# Patient Record
Sex: Male | Born: 1990 | Race: White | Hispanic: No | Marital: Single | Smoking: Never smoker
Health system: Southern US, Community
[De-identification: ages and names within clinical notes are randomized; demographics above are authoritative.]

## PROBLEM LIST (undated history)

## (undated) DIAGNOSIS — E7800 Pure hypercholesterolemia, unspecified: Secondary | ICD-10-CM

---
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
## 2010-07-22 ENCOUNTER — Emergency Department (HOSPITAL_COMMUNITY)
Admission: EM | Admit: 2010-07-22 | Discharge: 2010-07-22 | Disposition: A | Discharge status: Home / Self Care | Payer: Medicaid Other | Attending: Emergency Medicine | Admitting: Emergency Medicine

## 2010-07-22 DIAGNOSIS — F909 Attention-deficit hyperactivity disorder, unspecified type: Secondary | ICD-10-CM | POA: Insufficient documentation

## 2010-07-22 DIAGNOSIS — R51 Headache: Secondary | ICD-10-CM | POA: Insufficient documentation

## 2010-07-22 DIAGNOSIS — B9789 Other viral agents as the cause of diseases classified elsewhere: Secondary | ICD-10-CM | POA: Insufficient documentation

## 2010-07-22 DIAGNOSIS — R509 Fever, unspecified: Secondary | ICD-10-CM | POA: Insufficient documentation

## 2010-07-22 LAB — COMPREHENSIVE METABOLIC PANEL
ALT: 18 U/L (ref 0–53)
AST: 28 U/L (ref 0–37)
CO2: 27 mEq/L (ref 19–32)
Creatinine, Ser: 0.9 mg/dL (ref 0.4–1.5)
GFR calc Af Amer: >60 mL/min (ref >60)
GFR calc non Af Amer: >60 mL/min (ref >60)
Glucose, Bld: 102 mg/dL — ABNORMAL HIGH (ref 70–99)
Sodium: 134 mEq/L — ABNORMAL LOW (ref 135–145)
Total Bilirubin: 0.9 mg/dL (ref 0.3–1.2)
Total Protein: 7.2 g/dL (ref 6.0–8.3)

## 2010-07-22 LAB — DIFFERENTIAL
Eosinophils Relative: 9 % — ABNORMAL HIGH (ref 0–5)
Lymphocytes Relative: 18 % (ref 12–46)
Lymphs Abs: 0.6 10*3/uL — ABNORMAL LOW (ref 0.7–4.0)
Monocytes Absolute: 0.5 10*3/uL (ref 0.1–1.0)
Monocytes Relative: 15 % — ABNORMAL HIGH (ref 3–12)

## 2010-07-22 LAB — RAPID STREP SCREEN (MED CTR MEBANE ONLY): Streptococcus, Group A Screen (Direct): NEGATIVE

## 2010-07-22 LAB — CBC
Hemoglobin: 15 g/dL (ref 13.0–17.0)
RDW: 12.7 % (ref 11.5–15.5)
WBC: 3.2 10*3/uL — ABNORMAL LOW (ref 4.0–10.5)

## 2010-07-22 LAB — MONONUCLEOSIS SCREEN: Mono Screen: NEGATIVE

## 2012-04-11 ENCOUNTER — Emergency Department (HOSPITAL_COMMUNITY)
Admission: EM | Admit: 2012-04-11 | Discharge: 2012-04-11 | Disposition: A | Discharge status: Home / Self Care | Payer: Self-pay | Attending: Emergency Medicine | Admitting: Emergency Medicine

## 2012-04-11 ENCOUNTER — Encounter (HOSPITAL_COMMUNITY): Payer: Self-pay | Admitting: Emergency Medicine

## 2012-04-11 ENCOUNTER — Emergency Department (HOSPITAL_COMMUNITY): Payer: Self-pay

## 2012-04-11 DIAGNOSIS — Z79899 Other long term (current) drug therapy: Secondary | ICD-10-CM | POA: Insufficient documentation

## 2012-04-11 DIAGNOSIS — S0180XA Unspecified open wound of other part of head, initial encounter: Secondary | ICD-10-CM | POA: Insufficient documentation

## 2012-04-11 DIAGNOSIS — IMO0002 Reserved for concepts with insufficient information to code with codable children: Secondary | ICD-10-CM

## 2012-04-11 DIAGNOSIS — S060X1A Concussion with loss of consciousness of 30 minutes or less, initial encounter: Secondary | ICD-10-CM | POA: Insufficient documentation

## 2012-04-11 DIAGNOSIS — Y92009 Unspecified place in unspecified non-institutional (private) residence as the place of occurrence of the external cause: Secondary | ICD-10-CM | POA: Insufficient documentation

## 2012-04-11 DIAGNOSIS — Y9329 Activity, other involving ice and snow: Secondary | ICD-10-CM | POA: Insufficient documentation

## 2012-04-11 DIAGNOSIS — S069X9A Unspecified intracranial injury with loss of consciousness of unspecified duration, initial encounter: Secondary | ICD-10-CM

## 2012-04-11 DIAGNOSIS — W010XXA Fall on same level from slipping, tripping and stumbling without subsequent striking against object, initial encounter: Secondary | ICD-10-CM | POA: Insufficient documentation

## 2012-04-11 DIAGNOSIS — Z23 Encounter for immunization: Secondary | ICD-10-CM | POA: Insufficient documentation

## 2012-04-11 MED ORDER — LIDOCAINE-EPINEPHRINE (PF) 2 %-1:200000 IJ SOLN
INTRAMUSCULAR | Status: AC
  Filled 2012-04-11: qty 20

## 2012-04-11 MED ORDER — TETANUS-DIPHTH-ACELL PERTUSSIS 5-2.5-18.5 LF-MCG/0.5 IM SUSP
0.5000 mL | Freq: Once | INTRAMUSCULAR | Status: AC
  Administered 2012-04-11: 0.5 mL via INTRAMUSCULAR
  Filled 2012-04-11: qty 0.5

## 2012-04-11 MED ORDER — LIDOCAINE-EPINEPHRINE-TETRACAINE (LET) SOLUTION
3.0000 mL | Freq: Once | NASAL | Status: DC
  Filled 2012-04-11: qty 3

## 2012-04-11 MED ORDER — LIDOCAINE-EPINEPHRINE-TETRACAINE (LET) SOLUTION
3.0000 mL | Freq: Once | NASAL | Status: AC
  Administered 2012-04-11: 3 mL via TOPICAL

## 2012-04-11 MED ORDER — IBUPROFEN 800 MG PO TABS
800.0000 mg | ORAL_TABLET | Freq: Once | ORAL | Status: AC
  Administered 2012-04-11: 800 mg via ORAL
  Filled 2012-04-11: qty 1

## 2012-04-11 MED ORDER — BACITRACIN-NEOMYCIN-POLYMYXIN 400-5-5000 EX OINT
TOPICAL_OINTMENT | Freq: Once | CUTANEOUS | Status: AC
  Administered 2012-04-11: 1 via TOPICAL
  Filled 2012-04-11: qty 1

## 2012-04-11 NOTE — ED Notes (Signed)
RN at bedside

## 2012-04-11 NOTE — ED Notes (Signed)
Pt fell coming outside. Pt states blacked out for a minute. Laceration noted to left eyebrow area.

## 2012-04-11 NOTE — ED Provider Notes (Signed)
History     CSN: [id number]  Arrival date & time 04/11/12  0846   First MD Initiated Contact with Patient 04/11/12 [phone]      Chief Complaint  Patient presents with  . Head Injury  . Laceration    (Consider location/radiation/quality/duration/timing/severity/associated sxs/prior treatment) HPI Comments: Dylan Hunter is a 22 y.o. Male presenting with head injury sustained when he slipped on ice coming out of his home.  He reports a 1-2 minute loss of consciousness which was unwitnessed.  He has a  Laceration to his left forehead which the bleeding is controlled after direct pressure.  He reports headache,  But denies dizziness, nausea, vomiting, neck pain or any focal weakness or numbness.  He is not utd on his tetanus.     The history is provided by the patient.    History reviewed. No pertinent past medical history.  History reviewed. No pertinent past surgical history.  No family history on file.  History  Substance Use Topics  . Smoking status: Never Smoker   . Smokeless tobacco: Not on file  . Alcohol Use: No      Review of Systems  Constitutional: Negative for fever.  HENT: Negative for congestion, sore throat and neck pain.   Eyes: Negative.  Negative for photophobia and visual disturbance.  Respiratory: Negative for chest tightness and shortness of breath.   Cardiovascular: Negative for chest pain.  Gastrointestinal: Negative for nausea and abdominal pain.  Genitourinary: Negative.   Musculoskeletal: Negative for joint swelling and arthralgias.  Skin: Positive for wound. Negative for rash.  Neurological: Positive for headaches. Negative for dizziness, weakness, light-headedness and numbness.  Psychiatric/Behavioral: Negative.     Allergies  Review of patient's allergies indicates no known allergies.  Home Medications   Current Outpatient Rx  Name  Route  Sig  Dispense  Refill  . cetirizine (ZYRTEC) 10 MG tablet   Oral   Take 10 mg by mouth  daily.           BP 115/67  Pulse 57  Temp(Src) 98.4 F (36.9 C) (Oral)  Resp 18  Ht 6\' 1"  (1.854 m)  Wt 212 lb (96.163 kg)  BMI 27.98 kg/m2  SpO2 99%  Physical Exam  Nursing note and vitals reviewed. Constitutional: He is oriented to person, place, and time. He appears well-developed and well-nourished.  Uncomfortable appearing  HENT:  Head: Normocephalic and atraumatic.  Mouth/Throat: Oropharynx is clear and moist.  Eyes: Conjunctivae and EOM are normal. Pupils are equal, round, and reactive to light.  Neck: Normal range of motion. Neck supple.  Cardiovascular: Normal rate, regular rhythm, normal heart sounds and intact distal pulses.   Pulmonary/Chest: Effort normal and breath sounds normal. He has no wheezes.  Abdominal: Soft. Bowel sounds are normal. There is no tenderness.  Musculoskeletal: Normal range of motion.  Lymphadenopathy:    He has no cervical adenopathy.  Neurological: He is alert and oriented to person, place, and time. He has normal strength. No sensory deficit. Gait normal. GCS eye subscore is 4. GCS verbal subscore is 5. GCS motor subscore is 6.  Normal heel-shin, normal rapid alternating movements. Cranial nerves III-XII intact.  No pronator drift.  Skin: Skin is warm and dry. No rash noted.  3 cm subcutaneous laceration left forehead/ temple area,  Hemostatic.  Psychiatric: He has a normal mood and affect. His speech is normal and behavior is normal. Thought content normal. Cognition and memory are normal.    ED Course  Procedures (  including critical care time)  Labs Reviewed - No data to display Ct Head Wo Contrast  04/11/2012  *RADIOLOGY REPORT*  Clinical Data: Fall on ice, head injury, left supraorbital laceration  CT HEAD WITHOUT CONTRAST  Technique:  Contiguous axial images were obtained from the base of the skull through the vertex without contrast.  Comparison: 09/14/2007  Findings: Normal ventricular morphology. No midline shift or mass  effect. Normal appearance brain parenchyma. No intracranial hemorrhage, mass lesion or evidence of acute infarction. No extra-axial fluid collections. Bones and sinuses unremarkable.  IMPRESSION: No acute intracranial abnormalities.   Original Report Authenticated By: Ulyses Southward, M.D.      1. Minor head injury with loss of consciousness, initial encounter   2. Laceration       MDM  Patient with head injury with unwitness loc.  Instructions given for signs/symptoms that should prompt re-eval.  No focal findings on exam today.  Patients labs and/or radiological studies were reviewed during the medical decision making and disposition process. Ct scan negative for intracranial injury.  Advised suture removal in 5 days.  Tetanus updated.   LACERATION REPAIR Performed by: Burgess Amor Authorized by: Burgess Amor Consent: Verbal consent obtained. Risks and benefits: risks, benefits and alternatives were discussed Consent given by: patient Patient identity confirmed: provided demographic data Prepped and Draped in normal sterile fashion Wound explored  Laceration Location: forehead  Laceration Length: 3cm  No Foreign Bodies seen or palpated  Anesthesia: topical let Local anesthetic:  let Irrigation method: syringe after using surclens spray Amount of cleaning: standard  Skin closure: ethilon 5-0  Number of sutures: 6  Technique: simple interrupted.  Patient tolerance: Patient tolerated the procedure well with no immediate complications.       Burgess Amor, [place] 04/11/12 1116

## 2012-04-16 ENCOUNTER — Encounter (HOSPITAL_COMMUNITY): Payer: Self-pay | Admitting: *Deleted

## 2012-04-16 ENCOUNTER — Emergency Department (HOSPITAL_COMMUNITY)
Admission: EM | Admit: 2012-04-16 | Discharge: 2012-04-16 | Disposition: A | Discharge status: Home / Self Care | Payer: Self-pay | Attending: Emergency Medicine | Admitting: Emergency Medicine

## 2012-04-16 DIAGNOSIS — R51 Headache: Secondary | ICD-10-CM | POA: Insufficient documentation

## 2012-04-16 DIAGNOSIS — Z4802 Encounter for removal of sutures: Secondary | ICD-10-CM | POA: Insufficient documentation

## 2012-04-16 MED ORDER — BACITRACIN ZINC 500 UNIT/GM EX OINT
TOPICAL_OINTMENT | CUTANEOUS | Status: AC
  Administered 2012-04-16: 1 via TOPICAL
  Filled 2012-04-16: qty 0.9

## 2012-04-16 MED ORDER — BACITRACIN ZINC 500 UNIT/GM EX OINT
TOPICAL_OINTMENT | Freq: Once | CUTANEOUS | Status: AC
  Administered 2012-04-16: 1 via TOPICAL

## 2012-04-16 NOTE — ED Notes (Signed)
Instructions reviewed and f/u information provided-verbalizes understanding.   

## 2012-04-16 NOTE — ED Notes (Signed)
Here for suture removal - 6 sutures to left brow.

## 2012-04-16 NOTE — ED Provider Notes (Signed)
History    This chart was scribed for Shelda Jakes, MD by Sofie Rower, ED Scribe. The patient was seen in room [facility] and the patient's care was started at 7:19AM.    CSN: [id number]  Arrival date & time 04/16/12  0712   First MD Initiated Contact with Patient 04/16/12 0719      Chief Complaint  Patient presents with  . Suture / Staple Removal    (Consider location/radiation/quality/duration/timing/severity/associated sxs/prior treatment) Patient is a 22 y.o. male presenting with suture removal. The history is provided by the patient. No language interpreter was used.  Suture / Staple Removal  The sutures were placed 3 to 6 days ago. Treatments since wound repair include regular soap and water washings. His temperature was unmeasured prior to arrival. There has been no drainage from the wound. There is no redness present. There is no swelling present. The pain has no pain.    Dylan Hunter is a 22 y.o. male , with a hx of laceration repair, y shaped, located at the left eyebrow, well healed, 6 stitches placed in total (5 days ago, 04/11/12), who presents to the Emergency Department complaining of gradual, progressively improving, suture removal, located above the left eyebrow, onset today (04/16/12).  Associated symptoms include headache. The pt reports he is here at [facility] to have sutures removed, after having a laceration repair performed above his left eye on 04/11/12.   The pt denies fever, chest pain, abdominal pian, nausea, vomiting, and visual changes.   The pt does not smoke or drink alcohol.       History reviewed. No pertinent past medical history.  History reviewed. No pertinent past surgical history.  No family history on file.  History  Substance Use Topics  . Smoking status: Never Smoker   . Smokeless tobacco: Not on file  . Alcohol Use: No      Review of Systems  Constitutional: Negative for fever.  HENT: Negative for neck pain.   Eyes: Negative  for visual disturbance.  Respiratory: Negative for shortness of breath.   Cardiovascular: Negative for chest pain.  Gastrointestinal: Negative for nausea, vomiting and abdominal pain.  Musculoskeletal: Negative for back pain.  Skin: Negative for rash.  Neurological: Positive for headaches.  Hematological: Does not bruise/bleed easily.  All other systems reviewed and are negative.    Allergies  Review of patient's allergies indicates no known allergies.  Home Medications   Current Outpatient Rx  Name  Route  Sig  Dispense  Refill  . cetirizine (ZYRTEC) 10 MG tablet   Oral   Take 10 mg by mouth daily.           BP 130/67  Pulse 55  Temp(Src) 97.8 F (36.6 C) (Oral)  Ht 6\' 1"  (1.854 m)  Wt 212 lb (96.163 kg)  BMI 27.98 kg/m2  SpO2 98%  Physical Exam  Nursing note and vitals reviewed. Constitutional: He is oriented to person, place, and time. He appears well-developed and well-nourished. No distress.  HENT:  Head: Normocephalic and atraumatic.  Mouth/Throat: Oropharynx is clear and moist.  Eyes: Conjunctivae and EOM are normal. Pupils are equal, round, and reactive to light.  Eyes tracking well on exam.   Neck: Neck supple. No tracheal deviation present.  Cardiovascular: Normal rate, regular rhythm and normal heart sounds.  Exam reveals no gallop and no friction rub.   No murmur heard. Pulmonary/Chest: Effort normal and breath sounds normal. No respiratory distress. He has no wheezes. He has no  rales.  Abdominal: Soft. Bowel sounds are normal. He exhibits no distension. There is no tenderness.  Musculoskeletal: Normal range of motion. He exhibits no edema.  Lymphadenopathy:    He has no cervical adenopathy.  Neurological: He is alert and oriented to person, place, and time. No cranial nerve deficit or sensory deficit.  Skin: Skin is warm and dry.  Psychiatric: He has a normal mood and affect. His behavior is normal.    ED Course  Procedures (including critical  care time)  DIAGNOSTIC STUDIES: Oxygen Saturation is 98% on room air, normal by my interpretation.    COORDINATION OF CARE:   7:32 AM- Treatment plan discussed with patient. Pt agrees with treatment.     Labs Reviewed - No data to display No results found.   1. Visit for suture removal       MDM  Left eyebrow laceration from February 14 has healed well. Sutures can be removed.      I personally performed the services described in this documentation, which was scribed in my presence. The recorded information has been reviewed and is accurate.     Shelda Jakes, MD 04/16/12 [phone]

## 2012-04-21 NOTE — ED Provider Notes (Signed)
Medical screening examination/treatment/procedure(s) were performed by non-physician practitioner and as supervising physician I was immediately available for consultation/collaboration.  Hurman Horn, MD 04/21/12 [phone]

## 2013-01-05 ENCOUNTER — Emergency Department (HOSPITAL_COMMUNITY): Payer: Self-pay

## 2013-01-05 ENCOUNTER — Encounter (HOSPITAL_COMMUNITY): Payer: Self-pay | Admitting: Emergency Medicine

## 2013-01-05 ENCOUNTER — Emergency Department (HOSPITAL_COMMUNITY)
Admission: EM | Admit: 2013-01-05 | Discharge: 2013-01-05 | Disposition: A | Discharge status: Home / Self Care | Payer: Self-pay | Attending: Emergency Medicine | Admitting: Emergency Medicine

## 2013-01-05 DIAGNOSIS — Z79899 Other long term (current) drug therapy: Secondary | ICD-10-CM | POA: Insufficient documentation

## 2013-01-05 DIAGNOSIS — IMO0002 Reserved for concepts with insufficient information to code with codable children: Secondary | ICD-10-CM | POA: Insufficient documentation

## 2013-01-05 DIAGNOSIS — J209 Acute bronchitis, unspecified: Secondary | ICD-10-CM | POA: Insufficient documentation

## 2013-01-05 DIAGNOSIS — R509 Fever, unspecified: Secondary | ICD-10-CM | POA: Insufficient documentation

## 2013-01-05 DIAGNOSIS — J9801 Acute bronchospasm: Secondary | ICD-10-CM | POA: Insufficient documentation

## 2013-01-05 MED ORDER — ALBUTEROL SULFATE HFA 108 (90 BASE) MCG/ACT IN AERS
2.0000 | INHALATION_SPRAY | RESPIRATORY_TRACT | Status: DC | PRN
  Administered 2013-01-05: 2 via RESPIRATORY_TRACT
  Filled 2013-01-05: qty 6.7

## 2013-01-05 MED ORDER — ALBUTEROL SULFATE (5 MG/ML) 0.5% IN NEBU
5.0000 mg | INHALATION_SOLUTION | Freq: Once | RESPIRATORY_TRACT | Status: AC
  Administered 2013-01-05: 5 mg via RESPIRATORY_TRACT
  Filled 2013-01-05: qty 1

## 2013-01-05 MED ORDER — BENZONATATE 100 MG PO CAPS
200.0000 mg | ORAL_CAPSULE | Freq: Once | ORAL | Status: AC
  Administered 2013-01-05: 200 mg via ORAL
  Filled 2013-01-05: qty 2

## 2013-01-05 MED ORDER — PREDNISONE 20 MG PO TABS: 60.0000 mg | ORAL_TABLET | Freq: Every day | ORAL | Status: DC

## 2013-01-05 MED ORDER — PREDNISONE 50 MG PO TABS
60.0000 mg | ORAL_TABLET | Freq: Once | ORAL | Status: AC
  Administered 2013-01-05: 60 mg via ORAL
  Filled 2013-01-05 (×2): qty 1

## 2013-01-05 MED ORDER — BENZONATATE 100 MG PO CAPS: 200.0000 mg | ORAL_CAPSULE | Freq: Three times a day (TID) | ORAL | Status: DC | PRN

## 2013-01-05 MED ORDER — IPRATROPIUM BROMIDE 0.02 % IN SOLN
0.5000 mg | Freq: Once | RESPIRATORY_TRACT | Status: AC
  Administered 2013-01-05: 0.5 mg via RESPIRATORY_TRACT
  Filled 2013-01-05: qty 2.5

## 2013-01-05 NOTE — ED Notes (Signed)
Cough for 1 week, clear- yellow sputum.  Fever few days ago.

## 2013-01-08 NOTE — ED Provider Notes (Signed)
CSN: [id number]     Arrival date & time 01/05/13  1245 History   First MD Initiated Contact with Patient 01/05/13 1448     Chief Complaint  Patient presents with  . URI   (Consider location/radiation/quality/duration/timing/severity/associated sxs/prior Treatment) HPI Comments: Dylan Hunter is a 22 y.o. Male 7 day history of uri type symptoms which includes nasal congestion with clear rhinorrhea, low grade fever which cleared 2 days ago,  and cough and wheezing productive of occasional yellow sputum.  Symptoms due to not include sinus pain, shortness of breath, chest pain,  Nausea, vomiting or diarrhea.  The patient has tried multiple cold remedies including nyquil, pseudephedrine, loratadine and ibuprofen prior to arrival with no significant improvement in symptoms.   The history is provided by the patient.    History reviewed. No pertinent past medical history. History reviewed. No pertinent past surgical history. History reviewed. No pertinent family history. History  Substance Use Topics  . Smoking status: Never Smoker   . Smokeless tobacco: Not on file  . Alcohol Use: No    Review of Systems  Constitutional: Positive for fever.  HENT: Positive for congestion and rhinorrhea. Negative for sore throat.   Eyes: Negative.  Negative for discharge.  Respiratory: Positive for cough and wheezing. Negative for chest tightness and shortness of breath.   Cardiovascular: Negative for chest pain.  Gastrointestinal: Negative for nausea and abdominal pain.  Genitourinary: Negative.   Musculoskeletal: Negative for arthralgias, joint swelling and neck pain.  Skin: Negative.  Negative for rash and wound.  Neurological: Negative for dizziness, weakness, light-headedness, numbness and headaches.  Psychiatric/Behavioral: Negative.     Allergies  Review of patient's allergies indicates no known allergies.  Home Medications   Current Outpatient Rx  Name  Route  Sig  Dispense  Refill  .  DM-Doxylamine-Acetaminophen (NYQUIL COLD & FLU PO)   Oral   Take 2 capsules by mouth daily as needed (for cold relief).         Marland Kitchen ibuprofen (ADVIL,MOTRIN) 200 MG tablet   Oral   Take 200-400 mg by mouth every 6 (six) hours as needed for fever, headache, mild pain or moderate pain.         Marland Kitchen loratadine (ALLERGY RELIEF) 10 MG tablet   Oral   Take 10 mg by mouth daily.         . Pseudoephedrine-DM-GG-APAP (SB SEVERE COLD CONGESTION PO)   Oral   Take 2 capsules by mouth daily as needed (for cold/chest congestion).         . benzonatate (TESSALON) 100 MG capsule   Oral   Take 2 capsules (200 mg total) by mouth 3 (three) times daily as needed for cough.   30 capsule   0   . predniSONE (DELTASONE) 20 MG tablet   Oral   Take 3 tablets (60 mg total) by mouth daily.   12 tablet   0    BP 120/77  Pulse 61  Temp(Src) 97.8 F (36.6 C) (Oral)  Resp 20  Ht 6\' 2"  (1.88 m)  Wt 215 lb (97.523 kg)  BMI 27.59 kg/m2  SpO2 95% Physical Exam  Nursing note and vitals reviewed. Constitutional: He appears well-developed and well-nourished.  HENT:  Head: Normocephalic and atraumatic.  Eyes: Conjunctivae are normal.  Neck: Normal range of motion.  Cardiovascular: Normal rate, regular rhythm, normal heart sounds and intact distal pulses.   Pulmonary/Chest: Effort normal. He has wheezes. He has no rhonchi. He has no rales.  Expiratory wheeze bilaterally.  Abdominal: Soft. Bowel sounds are normal. There is no tenderness.  Musculoskeletal: Normal range of motion.  Neurological: He is alert.  Skin: Skin is warm and dry.  Psychiatric: He has a normal mood and affect.    ED Course  Procedures (including critical care time) Labs Review Labs Reviewed - No data to display Imaging Review No results found.  EKG Interpretation   None      Medications  predniSONE (DELTASONE) tablet 60 mg (60 mg Oral Given 01/05/13 1612)  albuterol (PROVENTIL) (5 MG/ML) 0.5% nebulizer solution 5 mg  (5 mg Nebulization Given 01/05/13 1554)  ipratropium (ATROVENT) nebulizer solution 0.5 mg (0.5 mg Nebulization Given 01/05/13 1554)  albuterol (PROVENTIL) (5 MG/ML) 0.5% nebulizer solution 5 mg (5 mg Nebulization Given 01/05/13 1719)  benzonatate (TESSALON) capsule 200 mg (200 mg Oral Given 01/05/13 1733)   Albuterol and atrovent neb given, prednisone 60 mg po,  Without significant improvement in wheeze or aeration.  Repeated albuterol neb x 1 with much better air movment, wheezing almost resolved.  He was prescribed pulse dose of prednisone,  Also given script for tessalon.  MDI given for home use.  MDM   1. Bronchitis, acute, with bronchospasm    Patients labs and/or radiological studies were viewed and considered during the medical decision making and disposition process. Pt stable for dc home,  Encouraged rest,  Increased fluid intake, prednisone, tesssalon, mdi.  Return instructions given for worsened sx.    Burgess Amor, PA-C 01/08/13 2221

## 2013-01-10 NOTE — ED Provider Notes (Signed)
Medical screening examination/treatment/procedure(s) were performed by non-physician practitioner and as supervising physician I was immediately available for consultation/collaboration.  EKG Interpretation   None        Raeford Razor, MD 01/10/13 [phone]

## 2013-10-19 ENCOUNTER — Emergency Department (HOSPITAL_COMMUNITY)
Admission: EM | Admit: 2013-10-19 | Discharge: 2013-10-19 | Disposition: A | Discharge status: Home / Self Care | Payer: Self-pay | Attending: Emergency Medicine | Admitting: Emergency Medicine

## 2013-10-19 ENCOUNTER — Encounter (HOSPITAL_COMMUNITY): Payer: Self-pay | Admitting: Emergency Medicine

## 2013-10-19 DIAGNOSIS — S99919A Unspecified injury of unspecified ankle, initial encounter: Secondary | ICD-10-CM

## 2013-10-19 DIAGNOSIS — S8000XA Contusion of unspecified knee, initial encounter: Secondary | ICD-10-CM | POA: Insufficient documentation

## 2013-10-19 DIAGNOSIS — Y929 Unspecified place or not applicable: Secondary | ICD-10-CM | POA: Insufficient documentation

## 2013-10-19 DIAGNOSIS — W1809XA Striking against other object with subsequent fall, initial encounter: Secondary | ICD-10-CM | POA: Insufficient documentation

## 2013-10-19 DIAGNOSIS — Z79899 Other long term (current) drug therapy: Secondary | ICD-10-CM | POA: Insufficient documentation

## 2013-10-19 DIAGNOSIS — IMO0002 Reserved for concepts with insufficient information to code with codable children: Secondary | ICD-10-CM | POA: Insufficient documentation

## 2013-10-19 DIAGNOSIS — S8001XA Contusion of right knee, initial encounter: Secondary | ICD-10-CM

## 2013-10-19 DIAGNOSIS — S8990XA Unspecified injury of unspecified lower leg, initial encounter: Secondary | ICD-10-CM | POA: Insufficient documentation

## 2013-10-19 DIAGNOSIS — Y939 Activity, unspecified: Secondary | ICD-10-CM | POA: Insufficient documentation

## 2013-10-19 DIAGNOSIS — S99929A Unspecified injury of unspecified foot, initial encounter: Secondary | ICD-10-CM

## 2013-10-19 MED ORDER — TRAMADOL HCL 50 MG PO TABS: ORAL_TABLET | ORAL | Status: DC

## 2013-10-19 MED ORDER — KETOROLAC TROMETHAMINE 10 MG PO TABS
10.0000 mg | ORAL_TABLET | Freq: Once | ORAL | Status: AC
  Administered 2013-10-19: 10 mg via ORAL
  Filled 2013-10-19: qty 1

## 2013-10-19 MED ORDER — DICLOFENAC SODIUM 75 MG PO TBEC: 75.0000 mg | DELAYED_RELEASE_TABLET | Freq: Two times a day (BID) | ORAL | Status: DC

## 2013-10-19 MED ORDER — PREDNISONE 50 MG PO TABS
60.0000 mg | ORAL_TABLET | Freq: Once | ORAL | Status: AC
  Administered 2013-10-19: 60 mg via ORAL
  Filled 2013-10-19 (×2): qty 1

## 2013-10-19 NOTE — ED Notes (Signed)
Assumed care for discharge only.  Prescriptions and discharge instructions given and reviewed with patient.  Patient verbalized understanding of possible sedating effects of medication and to follow up with orthopedics as needed.  Patient ambulatory with knee immobilizer applied; discharged home in good condition.

## 2013-10-19 NOTE — ED Provider Notes (Signed)
CSN: [id number]     Arrival date & time 10/19/13  2059 History   First MD Initiated Contact with Patient 10/19/13 2302     Chief Complaint  Patient presents with  . Leg Pain     (Consider location/radiation/quality/duration/timing/severity/associated sxs/prior Treatment) Patient is a 23 y.o. male presenting with leg pain. The history is provided by the patient.  Leg Pain Location:  Leg Time since incident:  1 week Injury: yes   Mechanism of injury comment:  Pt fell over an ottoman and hit knee on a Air Conditioning unit. Leg location:  R leg Pain details:    Quality:  Aching   Radiates to: lower leg.   Severity:  Moderate   Onset quality:  Gradual   Duration:  1 week   Timing:  Intermittent   Progression:  Worsening Chronicity:  New Dislocation: no   Relieved by:  Nothing Exacerbated by: bending and standing for prolong periods. Ineffective treatments:  NSAIDs Associated symptoms: tingling   Associated symptoms: no back pain, no muscle weakness and no neck pain   Risk factors: no frequent fractures and no known bone disorder     History reviewed. No pertinent past medical history. History reviewed. No pertinent past surgical history. History reviewed. No pertinent family history. History  Substance Use Topics  . Smoking status: Never Smoker   . Smokeless tobacco: Not on file  . Alcohol Use: No    Review of Systems  Constitutional: Negative for activity change.       All ROS Neg except as noted in HPI  HENT: Negative for nosebleeds.   Eyes: Negative for photophobia and discharge.  Respiratory: Negative for cough, shortness of breath and wheezing.   Cardiovascular: Negative for chest pain and palpitations.  Gastrointestinal: Negative for abdominal pain and blood in stool.  Genitourinary: Negative for dysuria, frequency and hematuria.  Musculoskeletal: Negative for arthralgias, back pain and neck pain.  Skin: Negative.   Neurological: Negative for dizziness,  seizures and speech difficulty.  Psychiatric/Behavioral: Negative for hallucinations and confusion.      Allergies  Review of patient's allergies indicates no known allergies.  Home Medications   Prior to Admission medications   Medication Sig Start Date End Date Taking? Authorizing Provider  benzonatate (TESSALON) 100 MG capsule Take 2 capsules (200 mg total) by mouth 3 (three) times daily as needed for cough. 01/05/13   Burgess Amor, PA-C  DM-Doxylamine-Acetaminophen (NYQUIL COLD & FLU PO) Take 2 capsules by mouth daily as needed (for cold relief).    Historical Provider, MD  ibuprofen (ADVIL,MOTRIN) 200 MG tablet Take 200-400 mg by mouth every 6 (six) hours as needed for fever, headache, mild pain or moderate pain.    Historical Provider, MD  loratadine (ALLERGY RELIEF) 10 MG tablet Take 10 mg by mouth daily.    Historical Provider, MD  predniSONE (DELTASONE) 20 MG tablet Take 3 tablets (60 mg total) by mouth daily. 01/05/13   Burgess Amor, PA-C  Pseudoephedrine-DM-GG-APAP (SB SEVERE COLD CONGESTION PO) Take 2 capsules by mouth daily as needed (for cold/chest congestion).    Historical Provider, MD   BP 142/75  Pulse 68  Temp(Src) 98.4 F (36.9 C) (Oral)  Resp 24  Ht 6' 1.5" (1.867 m)  Wt 215 lb (97.523 kg)  BMI 27.98 kg/m2  SpO2 99% Physical Exam  Nursing note and vitals reviewed. Constitutional: He is oriented to person, place, and time. He appears well-developed and well-nourished.  Non-toxic appearance.  HENT:  Head: Normocephalic.  Right  Ear: Tympanic membrane and external ear normal.  Left Ear: Tympanic membrane and external ear normal.  Eyes: EOM and lids are normal. Pupils are equal, round, and reactive to light.  Neck: Normal range of motion. Neck supple. Carotid bruit is not present.  Cardiovascular: Normal rate, regular rhythm, normal heart sounds, intact distal pulses and normal pulses.   Pulmonary/Chest: Breath sounds normal. No respiratory distress.  Abdominal:  Soft. Bowel sounds are normal. There is no tenderness. There is no guarding.  Musculoskeletal: Normal range of motion.       Right knee: He exhibits normal range of motion, no swelling, no effusion, no deformity, no laceration, no erythema and normal patellar mobility. Tenderness found.  Pain of the knee with flexion and extension. Mod crepitus. No effusion  Lymphadenopathy:       Head (right side): No submandibular adenopathy present.       Head (left side): No submandibular adenopathy present.    He has no cervical adenopathy.  Neurological: He is alert and oriented to person, place, and time. He has normal strength. No cranial nerve deficit or sensory deficit.  Skin: Skin is warm and dry.  Psychiatric: He has a normal mood and affect. His speech is normal.    ED Course  Procedures (including critical care time) Labs Review Labs Reviewed - No data to display  Imaging Review No results found.   EKG Interpretation None      MDM No evidence for fracture or dislocation. Pt fitted with knee immobilizer. Pt to follow up with Dr Romeo Apple. Rx for ultram, diclofenac, given   Final diagnoses:  None    *I have reviewed nursing notes, vital signs, and all appropriate lab and imaging results for this patient.Kathie Dike, PA-C 10/19/13 2334

## 2013-10-19 NOTE — ED Notes (Signed)
Pt fell on ottoman last week and c/o pain to right lower leg, numbness at first, feeling in leg now per pt, unable to bend at knee

## 2013-10-19 NOTE — Discharge Instructions (Signed)
Please use the knee immobilizer for the next 10 days. Please do not sleep in the device. Please use diclofenac daily with food. Use ultram for pain if needed. This medication may cause drowsiness, use with caution. Please see Dr Romeo Apple for orthopedic evaluation if not improving.

## 2013-10-20 NOTE — ED Provider Notes (Signed)
Medical screening examination/treatment/procedure(s) were performed by non-physician practitioner and as supervising physician I was immediately available for consultation/collaboration.    Vida Roller, MD 10/20/13 [phone]

## 2014-01-26 ENCOUNTER — Emergency Department (HOSPITAL_COMMUNITY)
Admission: EM | Admit: 2014-01-26 | Discharge: 2014-01-26 | Disposition: A | Discharge status: Home / Self Care | Payer: Self-pay | Attending: Emergency Medicine | Admitting: Emergency Medicine

## 2014-01-26 ENCOUNTER — Encounter (HOSPITAL_COMMUNITY): Payer: Self-pay | Admitting: Emergency Medicine

## 2014-01-26 DIAGNOSIS — L02411 Cutaneous abscess of right axilla: Secondary | ICD-10-CM | POA: Insufficient documentation

## 2014-01-26 DIAGNOSIS — Z792 Long term (current) use of antibiotics: Secondary | ICD-10-CM | POA: Insufficient documentation

## 2014-01-26 DIAGNOSIS — Z79899 Other long term (current) drug therapy: Secondary | ICD-10-CM | POA: Insufficient documentation

## 2014-01-26 DIAGNOSIS — L0291 Cutaneous abscess, unspecified: Secondary | ICD-10-CM

## 2014-01-26 MED ORDER — CLINDAMYCIN HCL 300 MG PO CAPS: 300.0000 mg | ORAL_CAPSULE | Freq: Four times a day (QID) | ORAL | Status: AC

## 2014-01-26 NOTE — ED Provider Notes (Signed)
CSN: [id number]     Arrival date & time 01/26/14  1351 History   First MD Initiated Contact with Patient 01/26/14 1434     Chief Complaint  Patient presents with  . Cyst     (Consider location/radiation/quality/duration/timing/severity/associated sxs/prior Treatment) HPI Comments: Pt come in today with complaints of a lump under his right arm times 1 week. Denies drainage from the area. Concerned about a pulled muscle. States that the area has been increasing and decreasing in size.   The history is provided by the patient. No language interpreter was used.    History reviewed. No pertinent past medical history. History reviewed. No pertinent past surgical history. History reviewed. No pertinent family history. History  Substance Use Topics  . Smoking status: Never Smoker   . Smokeless tobacco: Not on file  . Alcohol Use: No    Review of Systems  All other systems reviewed and are negative.     Allergies  Review of patient's allergies indicates no known allergies.  Home Medications   Prior to Admission medications   Medication Sig Start Date End Date Taking? Authorizing Provider  ibuprofen (ADVIL,MOTRIN) 200 MG tablet Take 200-400 mg by mouth every 6 (six) hours as needed for fever, headache, mild pain or moderate pain.   Yes Historical Provider, MD  clindamycin (CLEOCIN) 300 MG capsule Take 1 capsule (300 mg total) by mouth every 6 (six) hours. 01/26/14   Teressa LowerVrinda Jaxtin Raimondo, NP  DM-Doxylamine-Acetaminophen (NYQUIL COLD & FLU PO) Take 2 capsules by mouth daily as needed (for cold relief).    Historical Provider, MD  loratadine (ALLERGY RELIEF) 10 MG tablet Take 10 mg by mouth daily.    Historical Provider, MD   BP 121/69 mmHg  Pulse 62  Temp(Src) 98 F (36.7 C) (Oral)  Resp 18  Ht 6\' 1"  (1.854 m)  SpO2 98% Physical Exam  Constitutional: He is oriented to person, place, and time. He appears well-developed and well-nourished.  Cardiovascular: Normal rate and regular  rhythm.   Pulmonary/Chest: Effort normal and breath sounds normal.  Neurological: He is alert and oriented to person, place, and time.  Skin:  Small red raised area to red axilla. No fluctuance of firmness noted. Pt has full rom  Nursing note and vitals reviewed.   ED Course  Procedures (including critical care time) Labs Review Labs Reviewed - No data to display  Imaging Review No results found.   EKG Interpretation None      MDM   Final diagnoses:  Abscess    Will treat for any early abscess. Discussed return precautions    Teressa LowerVrinda Talajah Slimp, NP 01/26/14 1508  Benny LennertJoseph L Zammit, MD 01/26/14 [id number]

## 2014-01-26 NOTE — Discharge Instructions (Signed)
Return as discussed for worsening redness or swelling as discussed Abscess An abscess is an infected area that contains a collection of pus and debris.It can occur in almost any part of the body. An abscess is also known as a furuncle or boil. CAUSES  An abscess occurs when tissue gets infected. This can occur from blockage of oil or sweat glands, infection of hair follicles, or a minor injury to the skin. As the body tries to fight the infection, pus collects in the area and creates pressure under the skin. This pressure causes pain. People with weakened immune systems have difficulty fighting infections and get certain abscesses more often.  SYMPTOMS Usually an abscess develops on the skin and becomes a painful mass that is red, warm, and tender. If the abscess forms under the skin, you may feel a moveable soft area under the skin. Some abscesses break open (rupture) on their own, but most will continue to get worse without care. The infection can spread deeper into the body and eventually into the bloodstream, causing you to feel ill.  DIAGNOSIS  Your caregiver will take your medical history and perform a physical exam. A sample of fluid may also be taken from the abscess to determine what is causing your infection. TREATMENT  Your caregiver may prescribe antibiotic medicines to fight the infection. However, taking antibiotics alone usually does not cure an abscess. Your caregiver may need to make a small cut (incision) in the abscess to drain the pus. In some cases, gauze is packed into the abscess to reduce pain and to continue draining the area. HOME CARE INSTRUCTIONS   Only take over-the-counter or prescription medicines for pain, discomfort, or fever as directed by your caregiver.  If you were prescribed antibiotics, take them as directed. Finish them even if you start to feel better.  If gauze is used, follow your caregiver's directions for changing the gauze.  To avoid spreading the  infection:  Keep your draining abscess covered with a bandage.  Wash your hands well.  Do not share personal care items, towels, or whirlpools with others.  Avoid skin contact with others.  Keep your skin and clothes clean around the abscess.  Keep all follow-up appointments as directed by your caregiver. SEEK MEDICAL CARE IF:   You have increased pain, swelling, redness, fluid drainage, or bleeding.  You have muscle aches, chills, or a general ill feeling.  You have a fever. MAKE SURE YOU:   Understand these instructions.  Will watch your condition.  Will get help right away if you are not doing well or get worse. Document Released: 11/22/2004 Document Revised: 08/14/2011 Document Reviewed: 04/27/2011 [facility] Patient Information 2015 [employer] [place]. This information is not intended to replace advice given to you by your health care provider. Make sure you discuss any questions you have with your health care provider.

## 2014-01-26 NOTE — ED Notes (Addendum)
Pt reports "lump" under right axilla since last Wednesday. Pt denies fevers, n/v. nad noted.

## 2017-04-17 ENCOUNTER — Other Ambulatory Visit: Payer: Self-pay

## 2017-04-17 ENCOUNTER — Emergency Department (HOSPITAL_COMMUNITY)
Admission: EM | Admit: 2017-04-17 | Discharge: 2017-04-17 | Disposition: A | Discharge status: Home / Self Care | Payer: Self-pay | Attending: Emergency Medicine | Admitting: Emergency Medicine

## 2017-04-17 ENCOUNTER — Encounter (HOSPITAL_COMMUNITY): Payer: Self-pay | Admitting: Emergency Medicine

## 2017-04-17 DIAGNOSIS — R21 Rash and other nonspecific skin eruption: Secondary | ICD-10-CM

## 2017-04-17 DIAGNOSIS — B029 Zoster without complications: Secondary | ICD-10-CM | POA: Insufficient documentation

## 2017-04-17 MED ORDER — ACYCLOVIR 800 MG PO TABS: 800.0000 mg | ORAL_TABLET | Freq: Every day | ORAL | 0 refills | Status: AC

## 2017-04-17 MED ORDER — TRIAMCINOLONE ACETONIDE 0.1 % EX CREA: 1.0000 "application " | TOPICAL_CREAM | Freq: Three times a day (TID) | CUTANEOUS | 0 refills | Status: AC

## 2017-04-17 NOTE — ED Triage Notes (Signed)
Pt c/o breakout to the right side of face since 2/14.

## 2017-04-17 NOTE — ED Provider Notes (Signed)
[facility] Provider Note   CSN: [id number] Arrival date & time: 04/17/17  1843     History   Chief Complaint Chief Complaint  Patient presents with  . Acne    HPI Dylan Hunter is a 27 y.o. male.  HPI   Dylan Hunter is a 27 y.o. male who presents to the Emergency Department complaining of burning, tingling, and itching of the right face.  He noticed symptoms began on 04/12/2017 with a small red lesion just above the upper lip.  He states yesterday he noticed a similar appearing lesion on the upper right face.  He describes the areas is mildly painful.  He denies fever, chills, facial swelling, sore throat, visual changes, and ear pain.  No history of cold sores or genital lesions.  He has an appointment next week with dermatology.  History reviewed. No pertinent past medical history.  There are no active problems to display for this patient.   History reviewed. No pertinent surgical history.     Home Medications    Prior to Admission medications   Medication Sig Start Date End Date Taking? Authorizing Provider  acyclovir (ZOVIRAX) 800 MG tablet Take 1 tablet (800 mg total) by mouth 5 (five) times daily. 04/17/17   Destanie Tibbetts, PA-C  clindamycin (CLEOCIN) 300 MG capsule Take 1 capsule (300 mg total) by mouth every 6 (six) hours. 01/26/14   Teressa LowerPickering, Vrinda, NP  DM-Doxylamine-Acetaminophen (NYQUIL COLD & FLU PO) Take 2 capsules by mouth daily as needed (for cold relief).    [provider]  ibuprofen (ADVIL,MOTRIN) 200 MG tablet Take 200-400 mg by mouth every 6 (six) hours as needed for fever, headache, mild pain or moderate pain.    [provider]  loratadine (ALLERGY RELIEF) 10 MG tablet Take 10 mg by mouth daily.    [provider]  triamcinolone cream (KENALOG) 0.1 % Apply 1 application topically 3 (three) times daily. 04/17/17   Pauline Ausriplett, Mitsuo Budnick, PA-C    Family History No family history on file.  Social  History Social History   Tobacco Use  . Smoking status: Never Smoker  . Smokeless tobacco: Never Used  Substance Use Topics  . Alcohol use: No  . Drug use: No     Allergies   Patient has no known allergies.   Review of Systems Review of Systems  Constitutional: Negative for activity change, appetite change, chills and fever.  HENT: Negative for ear pain, facial swelling, mouth sores, sore throat and trouble swallowing.   Respiratory: Negative for chest tightness, shortness of breath and wheezing.   Genitourinary: Negative for discharge, dysuria, genital sores, penile swelling, scrotal swelling and testicular pain.  Musculoskeletal: Negative for neck pain and neck stiffness.  Skin: Positive for rash. Negative for wound.  Neurological: Negative for dizziness, weakness, numbness and headaches.  All other systems reviewed and are negative.    Physical Exam Updated Vital Signs BP (!) 159/100 (BP Location: Right Arm)   Pulse 78   Temp 99 F (37.2 C) (Oral)   Resp 17   Wt 104.3 kg (230 lb)   SpO2 98%   BMI 30.34 kg/m   Physical Exam  Constitutional: He is oriented to person, place, and time. He appears well-developed and well-nourished. No distress.  HENT:  Head: Normocephalic and atraumatic.  Mouth/Throat: Oropharynx is clear and moist.  Neck: Normal range of motion. Neck supple.  Cardiovascular: Normal rate, regular rhythm, normal heart sounds and intact distal pulses.  No murmur  heard. Pulmonary/Chest: Effort normal and breath sounds normal. No respiratory distress.  Musculoskeletal: He exhibits no edema or tenderness.  Lymphadenopathy:    He has no cervical adenopathy.  Neurological: He is alert and oriented to person, place, and time. He exhibits normal muscle tone. Coordination normal.  Skin: Skin is warm. Rash noted. There is erythema.  Dime size macular lesion to the right upper face and 2 cm, crusted lesion just superior to right upper lip.  No edema, no  vesicular lesions present.  No periorbital lesions or facial edema  Nursing note and vitals reviewed.    ED Treatments / Results  Labs (all labs ordered are listed, but only abnormal results are displayed) Labs Reviewed  HSV CULTURE AND TYPING    EKG  EKG Interpretation None       Radiology No results found.  Procedures Procedures (including critical care time)  Medications Ordered in ED Medications - No data to display   Initial Impression / Assessment and Plan / ED Course  I have reviewed the triage vital signs and the nursing notes.  Pertinent labs & imaging results that were available during my care of the patient were reviewed by me and considered in my medical decision making (see chart for details).     Patient with possible herpetic lesions.  Discussed possible herpes simplex versus zoster.  Culture pending, will treat with antiviral.  Patient has previously scheduled appointment with dermatology.  He appears safe for discharge home.  Final Clinical Impressions(s) / ED Diagnoses   Final diagnoses:  Rash of face  Herpes zoster without complication    ED Discharge Orders        Ordered    acyclovir (ZOVIRAX) 800 MG tablet  5 times daily     04/17/17 2011    triamcinolone cream (KENALOG) 0.1 %  3 times daily     04/17/17 2011       Pauline Aus, Cordelia Poche 04/17/17 2019    Mesner, Barbara Cower, MD 04/17/17 2251

## 2017-04-17 NOTE — Discharge Instructions (Signed)
Keep your appt with your dermatologist.   Someone from the hospital will contact you if the results are positive

## 2017-04-21 LAB — HSV CULTURE AND TYPING

## 2017-11-21 ENCOUNTER — Emergency Department (HOSPITAL_COMMUNITY)
Admission: EM | Admit: 2017-11-21 | Discharge: 2017-11-21 | Disposition: A | Discharge status: Home / Self Care | Payer: 59 | Attending: Emergency Medicine | Admitting: Emergency Medicine

## 2017-11-21 ENCOUNTER — Encounter (HOSPITAL_COMMUNITY): Payer: Self-pay

## 2017-11-21 ENCOUNTER — Other Ambulatory Visit: Payer: Self-pay

## 2017-11-21 DIAGNOSIS — Z79899 Other long term (current) drug therapy: Secondary | ICD-10-CM | POA: Diagnosis not present

## 2017-11-21 DIAGNOSIS — B356 Tinea cruris: Secondary | ICD-10-CM | POA: Diagnosis not present

## 2017-11-21 DIAGNOSIS — N5089 Other specified disorders of the male genital organs: Secondary | ICD-10-CM | POA: Diagnosis present

## 2017-11-21 MED ORDER — CLOTRIMAZOLE 1 % EX CREA: TOPICAL_CREAM | CUTANEOUS | 0 refills | Status: AC

## 2017-11-21 NOTE — ED Provider Notes (Signed)
[facility] EMERGENCY DEPARTMENT Provider Note   CSN: [id number] Arrival date & time: 11/21/17  4098     History   Chief Complaint Chief Complaint  Patient presents with  . Testicle Pain    HPI Dylan Hunter is a 27 y.o. male.  HPI   Dylan Hunter is a 27yo male with no significant past medical history presents to the emergency department for evaluation of testicular itching.  Patient reports that this is been intermittent for several months now.  He also has noticed a red rash over his scrotum.  He denies testicular pain or swelling.  No fever, chills, dysuria, urinary frequency, penile discharge, abdominal pain, nausea/vomiting.  He has not tried any over-the-counter medications for his symptoms.  He states that yesterday working in the heat the itching became significantly worse.  History reviewed. No pertinent past medical history.  There are no active problems to display for this patient.   History reviewed. No pertinent surgical history.      Home Medications    Prior to Admission medications   Medication Sig Start Date End Date Taking? Authorizing Provider  acyclovir (ZOVIRAX) 800 MG tablet Take 1 tablet (800 mg total) by mouth 5 (five) times daily. 04/17/17   Triplett, Tammy, PA-C  clindamycin (CLEOCIN) 300 MG capsule Take 1 capsule (300 mg total) by mouth every 6 (six) hours. 01/26/14   Teressa Lower, NP  DM-Doxylamine-Acetaminophen (NYQUIL COLD & FLU PO) Take 2 capsules by mouth daily as needed (for cold relief).    [provider]  ibuprofen (ADVIL,MOTRIN) 200 MG tablet Take 200-400 mg by mouth every 6 (six) hours as needed for fever, headache, mild pain or moderate pain.    [provider]  loratadine (ALLERGY RELIEF) 10 MG tablet Take 10 mg by mouth daily.    [provider]  triamcinolone cream (KENALOG) 0.1 % Apply 1 application topically 3 (three) times daily. 04/17/17   Pauline Aus, PA-C    Family History No family history  on file.  Social History Social History   Tobacco Use  . Smoking status: Never Smoker  . Smokeless tobacco: Never Used  Substance Use Topics  . Alcohol use: No  . Drug use: No     Allergies   Patient has no known allergies.   Review of Systems Review of Systems  Constitutional: Negative for chills and fever.  Gastrointestinal: Negative for abdominal pain and vomiting.  Genitourinary: Negative for difficulty urinating, discharge, dysuria, frequency, penile pain, scrotal swelling and testicular pain.  Skin: Positive for color change and rash.     Physical Exam Updated Vital Signs BP 120/89 (BP Location: Left Arm)   Pulse (!) 52   Temp 98 F (36.7 C) (Oral)   Resp 18   Ht 6\' 2"  (1.88 m)   Wt 104.3 kg   SpO2 99%   BMI 29.53 kg/m   Physical Exam  Constitutional: He is oriented to person, place, and time. He appears well-developed and well-nourished. No distress.  NAD  HENT:  Head: Normocephalic and atraumatic.  Eyes: Right eye exhibits no discharge. Left eye exhibits no discharge.  Cardiovascular: Normal rate and regular rhythm.  Pulmonary/Chest: Effort normal and breath sounds normal. No stridor. No respiratory distress. He has no wheezes. He has no rales.  Abdominal: Soft. Bowel sounds are normal. There is no tenderness.  Genitourinary:  Genitourinary Comments: Chaperone present for exam. Scrotum and groin area with erythematous, thickened rash with satellite spots. The testicles with out tenderness or  mass. No discharge or lesion on the penis.  Musculoskeletal: Normal range of motion.  Neurological: He is alert and oriented to person, place, and time. Coordination normal.  Skin: Skin is warm and dry. He is not diaphoretic.  Psychiatric: He has a normal mood and affect. His behavior is normal.  Nursing note and vitals reviewed.    ED Treatments / Results  Labs (all labs ordered are listed, but only abnormal results are displayed) Labs Reviewed - No data to  display  EKG None  Radiology No results found.  Procedures Procedures (including critical care time)  Medications Ordered in ED Medications - No data to display   Initial Impression / Assessment and Plan / ED Course  I have reviewed the triage vital signs and the nursing notes.  Pertinent labs & imaging results that were available during my care of the patient were reviewed by me and considered in my medical decision making (see chart for details).     Exam consistent with jock itch. Patient will be discharged with clotrimazole cream. Discussed reasons to return to the ED and he agrees.   Final Clinical Impressions(s) / ED Diagnoses   Final diagnoses:  Jock itch    ED Discharge Orders         Ordered    clotrimazole (LOTRIMIN) 1 % cream     11/21/17 0815           Kellie Shropshire, PA-C 11/21/17 0827    Mesner, Barbara Cower, MD 11/21/17 1424

## 2017-11-21 NOTE — ED Triage Notes (Signed)
Pt reports that he has testicular itching since yesterday. Scrotum is red. Pt reports that he had a tick bite on his scrotum in may and has had intermittent itching

## 2017-11-21 NOTE — Discharge Instructions (Addendum)
Apply antifungal cream twice a day.   Return to the ER if you have any new or concerning symptoms like testicular pain, testicular swelling, fever, abdominal pain.

## 2019-03-01 ENCOUNTER — Emergency Department (HOSPITAL_COMMUNITY)
Admission: EM | Admit: 2019-03-01 | Discharge: 2019-03-01 | Disposition: A | Discharge status: Home / Self Care | Payer: Self-pay | Attending: Emergency Medicine | Admitting: Emergency Medicine

## 2019-03-01 ENCOUNTER — Encounter (HOSPITAL_COMMUNITY): Payer: Self-pay

## 2019-03-01 ENCOUNTER — Other Ambulatory Visit: Payer: Self-pay

## 2019-03-01 ENCOUNTER — Emergency Department (HOSPITAL_COMMUNITY): Payer: Self-pay

## 2019-03-01 DIAGNOSIS — R0789 Other chest pain: Secondary | ICD-10-CM | POA: Insufficient documentation

## 2019-03-01 DIAGNOSIS — R079 Chest pain, unspecified: Secondary | ICD-10-CM

## 2019-03-01 HISTORY — DX: Pure hypercholesterolemia, unspecified: E78.00

## 2019-03-01 MED ORDER — KETOROLAC TROMETHAMINE 60 MG/2ML IM SOLN
60.0000 mg | Freq: Once | INTRAMUSCULAR | Status: AC
  Administered 2019-03-01: 04:00:00 60 mg via INTRAMUSCULAR
  Filled 2019-03-01: qty 2

## 2019-03-01 MED ORDER — METHOCARBAMOL 500 MG PO TABS
500.0000 mg | ORAL_TABLET | Freq: Once | ORAL | Status: AC
  Administered 2019-03-01: 500 mg via ORAL
  Filled 2019-03-01: qty 1

## 2019-03-01 MED ORDER — OXYCODONE-ACETAMINOPHEN 5-325 MG PO TABS
1.0000 | ORAL_TABLET | Freq: Once | ORAL | Status: AC
Start: 2019-03-01 — End: 2019-03-01
  Administered 2019-03-01: 04:00:00 1 via ORAL
  Filled 2019-03-01: qty 1

## 2019-03-01 MED ORDER — IBUPROFEN 800 MG PO TABS: 800.0000 mg | ORAL_TABLET | Freq: Three times a day (TID) | ORAL | 0 refills | Status: AC

## 2019-03-01 MED ORDER — CYCLOBENZAPRINE HCL 10 MG PO TABS: 10.0000 mg | ORAL_TABLET | Freq: Two times a day (BID) | ORAL | 0 refills | Status: AC | PRN

## 2019-03-01 NOTE — ED Triage Notes (Signed)
Pt reports he was "playing video games from 8pm to 11pm last night and started having right sided chest pain around 11pm". Pt denies any other symptoms. Pt reports pain increases with movement and deep breathing.

## 2019-03-01 NOTE — ED Provider Notes (Signed)
Emergency Department Provider Note   I have reviewed the triage vital signs and the nursing notes.   HISTORY  Chief Complaint Chest Pain   HPI Dylan Hunter is a 29 y.o. male without significant past medical history who presents the emergency department today secondary to right-sided chest pain.  Patient states that started on 11:00 is worse with movement and specifically is movement of his right arm.  He states that it hurts worse when he walks and takes deep breath and labs.  Sharp pain right lateral chest.  He states he feels little short of breath just because it hurts to take a deep breath.  No lower extremity swelling.  No recent travel, surgeries or other prolonged immobilization.   No history of blood clots.  No history of coronary artery disease.  He does smoke but no alcohol or drugs.  No recent fever or cough.  No trauma.  No rash.  No other associated or modifying symptoms.    Past Medical History:  Diagnosis Date  . Hypercholesterolemia     There are no problems to display for this patient.   History reviewed. No pertinent surgical history.  Current Outpatient Rx  . Order #: [id number] Class: Print  . Order #: [id number] Class: Print  . Order #: [id number] Class: Print  . Order #: [id number] Class: Normal  . Order #: [id number] Class: Historical Med  . Order #: [id number] Class: Normal  . Order #: [id number] Class: Historical Med  . Order #: [id number] Class: Print    Allergies Patient has no known allergies.  No family history on file.  Social History Social History   Tobacco Use  . Smoking status: Never Smoker  . Smokeless tobacco: Never Used  Substance Use Topics  . Alcohol use: No  . Drug use: No    Review of Systems  All other systems negative except as documented in the HPI. All pertinent positives and negatives as reviewed in the HPI. ____________________________________________   PHYSICAL EXAM:  VITAL SIGNS: ED Triage Vitals  Enc Vitals Group       BP 03/01/19 0240 (!) 138/91     Pulse Rate 03/01/19 0240 65     Resp 03/01/19 0240 17     Temp 03/01/19 0240 98.2 F (36.8 C)     Temp Source 03/01/19 0240 Oral     SpO2 03/01/19 0240 96 %     Weight 03/01/19 0233 245 lb (111.1 kg)     Height 03/01/19 0233 6\' 2"  (1.88 m)     Head Circumference --      Peak Flow --      Pain Score 03/01/19 0233 5     Pain Loc --      Pain Edu? --      Excl. in GC? --     Constitutional: Alert and oriented. Well appearing and in no acute distress. Eyes: Conjunctivae are normal. PERRL. EOMI. Head: Atraumatic. Nose: No congestion/rhinnorhea. Mouth/Throat: Mucous membranes are moist.  Oropharynx non-erythematous. Neck: No stridor.  No meningeal signs.   Cardiovascular: Normal rate, regular rhythm. Good peripheral circulation. Grossly normal heart sounds.   Respiratory: Normal respiratory effort.  No retractions. Lungs CTAB. Gastrointestinal: Soft and nontender. No distention.  Musculoskeletal: pain in lateral chest with ROM of right arm and with flexion of right pectoral. No obvious rash, mild ttp. Neurologic:  Normal speech and language. No gross focal neurologic deficits are appreciated.  Skin:  Skin is warm, dry and intact. No rash noted.   ____________________________________________   04/29/19 (  all labs ordered are listed, but only abnormal results are displayed)  Labs Reviewed - No data to display ____________________________________________  EKG   EKG Interpretation  Date/Time:    Ventricular Rate:    PR Interval:    QRS Duration:   QT Interval:    QTC Calculation:   R Axis:     Text Interpretation:         ____________________________________________  RADIOLOGY  DG Chest 1 View  Result Date: 03/01/2019 CLINICAL DATA:  Chest pain. EXAM: CHEST  1 VIEW COMPARISON:  01/05/2013 FINDINGS: The cardiomediastinal contours are normal. Subsegmental atelectasis at the left lung base. Pulmonary vasculature is normal. No  consolidation, pleural effusion, or pneumothorax. No acute osseous abnormalities are seen. IMPRESSION: Subsegmental atelectasis at the left lung base. Electronically Signed   By: Keith Rake M.D.   On: 03/01/2019 03:37    ____________________________________________   PROCEDURES  Procedure(s) performed:   Procedures   ____________________________________________   INITIAL IMPRESSION / ASSESSMENT AND PLAN / ED COURSE  Suspect MSK cause, ecg ok, if xr ok, will continue symptomatic treatment.   Workup reassuring. Pain relieved. Ambulated around department without significant pain, hypoxia or tachypnea. Likely MSK.     Dylan Hunter was evaluated in Emergency Department on 03/01/2019 for the symptoms described in the history of present illness. He was evaluated in the context of the global COVID-19 pandemic, which necessitated consideration that the patient might be at risk for infection with the SARS-CoV-2 virus that causes COVID-19. Institutional protocols and algorithms that pertain to the evaluation of patients at risk for COVID-19 are in a state of rapid change based on information released by regulatory bodies including the CDC and federal and state organizations. These policies and algorithms were followed during the patient's care in the ED.   Pertinent labs & imaging results that were available during my care of the patient were reviewed by me and considered in my medical decision making (see chart for details).  A medical screening exam was performed and I feel the patient has had an appropriate workup for their chief complaint at this time and likelihood of emergent condition existing is low. They have been counseled on decision, discharge, follow up and which symptoms necessitate immediate return to the emergency department. They or their family verbally stated understanding and agreement with plan and discharged in stable condition.     ____________________________________________  FINAL CLINICAL IMPRESSION(S) / ED DIAGNOSES  Final diagnoses:  Nonspecific chest pain     MEDICATIONS GIVEN DURING THIS VISIT:  Medications  oxyCODONE-acetaminophen (PERCOCET/ROXICET) 5-325 MG per tablet 1 tablet (1 tablet Oral Given 03/01/19 0335)  ketorolac (TORADOL) injection 60 mg (60 mg Intramuscular Given 03/01/19 0332)  methocarbamol (ROBAXIN) tablet 500 mg (500 mg Oral Given 03/01/19 0335)     NEW OUTPATIENT MEDICATIONS STARTED DURING THIS VISIT:  Discharge Medication List as of 03/01/2019  4:10 AM    START taking these medications   Details  cyclobenzaprine (FLEXERIL) 10 MG tablet Take 1 tablet (10 mg total) by mouth 2 (two) times daily as needed for muscle spasms., Starting Sun 03/01/2019, Normal        Note:  This note was prepared with assistance of Dragon voice recognition software. Occasional wrong-word or sound-a-like substitutions may have occurred due to the inherent limitations of voice recognition software.   Dow Blahnik, Corene Cornea, MD 03/01/19 0430

## 2019-03-01 NOTE — ED Notes (Signed)
Pt back from x-ray.

## 2020-09-23 IMAGING — DX DG CHEST 1V
1 series · 1 of 1 positions shown · non-contrast
Comparison: 01/05/2013

CLINICAL DATA: Chest pain.

EXAM:
CHEST  1 VIEW

[chest pa]
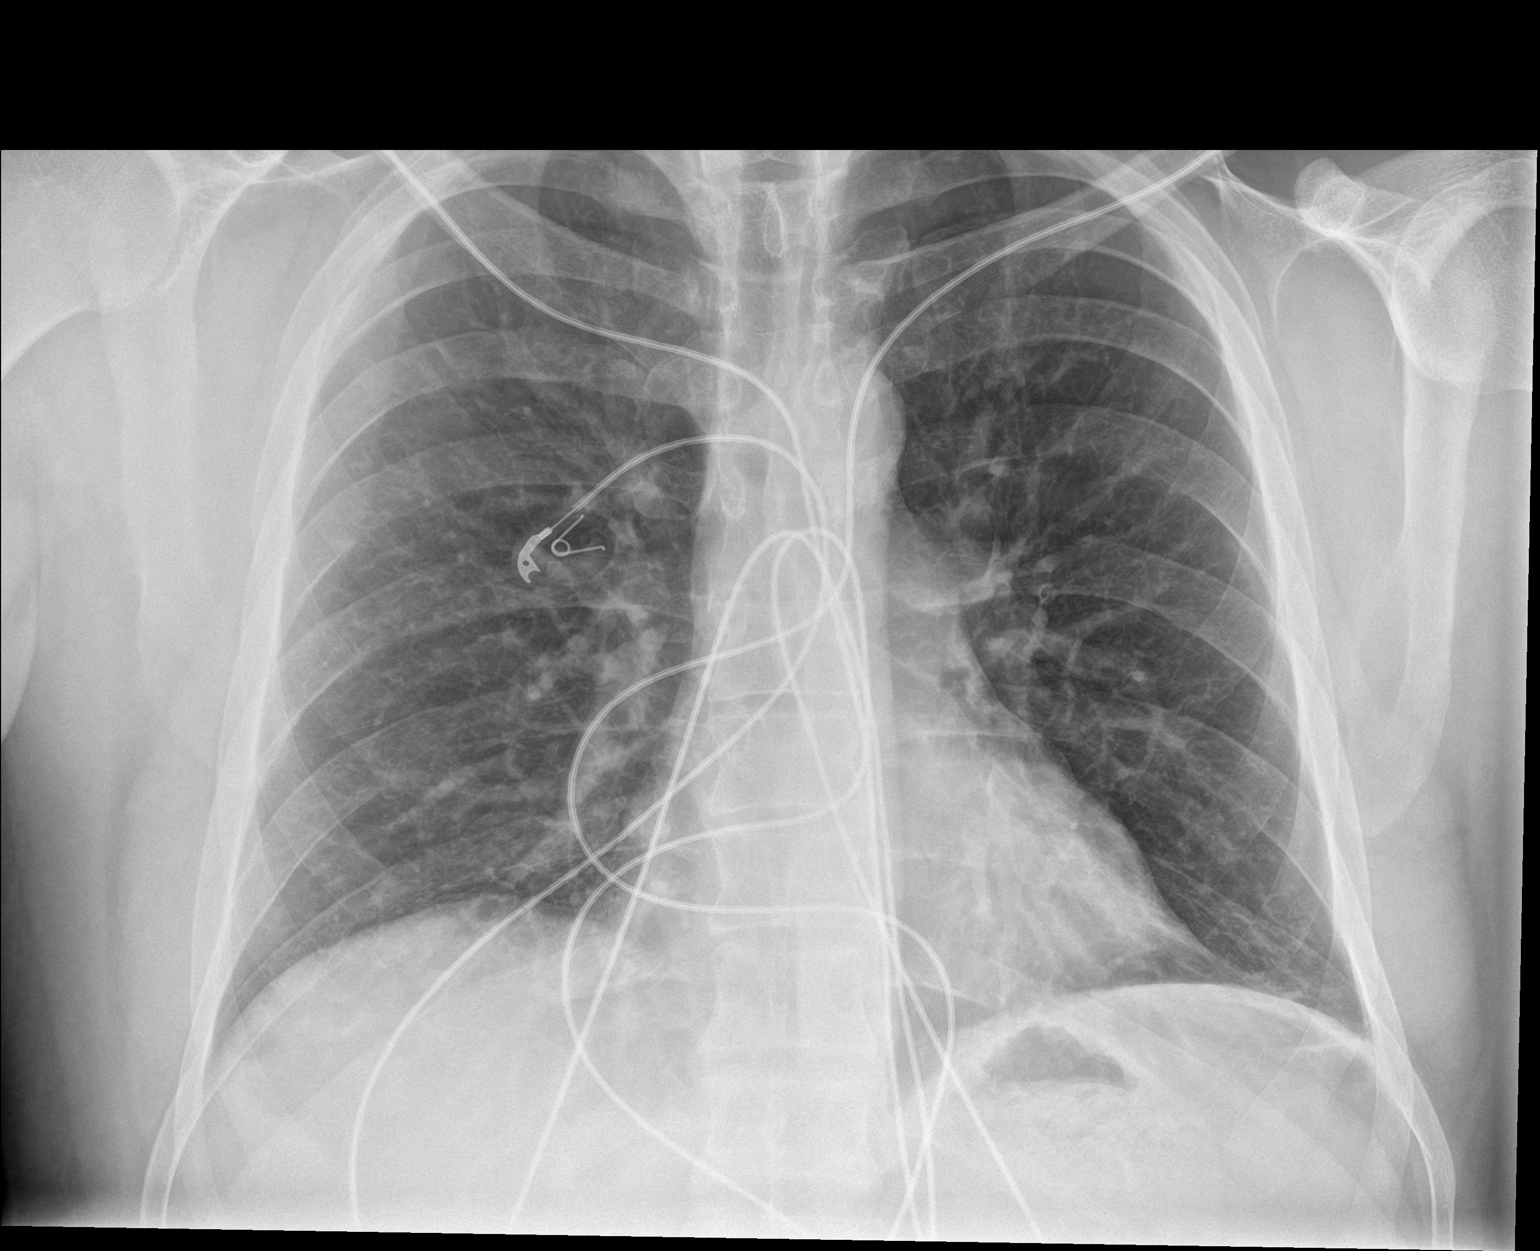

[1 of 1 positions shown; findings below may reference images not displayed]

FINDINGS: The cardiomediastinal contours are normal. Subsegmental atelectasis
at the left lung base. Pulmonary vasculature is normal. No
consolidation, pleural effusion, or pneumothorax. No acute osseous
abnormalities are seen.
IMPRESSION: Subsegmental atelectasis at the left lung base.
